# Patient Record
Sex: Female | Born: 1975 | Hispanic: No | Marital: Married
Health system: Southern US, Community
[De-identification: ages and names within clinical notes are randomized; demographics above are authoritative.]

---

## 2017-01-01 ENCOUNTER — Ambulatory Visit (HOSPITAL_COMMUNITY): Admission: EM | Admit: 2017-01-01 | Discharge: 2017-01-01 | Discharge status: Against Medical Advice | Payer: Self-pay

## 2018-07-04 ENCOUNTER — Emergency Department (HOSPITAL_COMMUNITY): Payer: BLUE CROSS/BLUE SHIELD

## 2018-07-04 ENCOUNTER — Emergency Department (HOSPITAL_COMMUNITY)
Admission: EM | Admit: 2018-07-04 | Discharge: 2018-07-04 | Disposition: A | Discharge status: Home / Self Care | Payer: BLUE CROSS/BLUE SHIELD | Attending: Emergency Medicine | Admitting: Emergency Medicine

## 2018-07-04 ENCOUNTER — Other Ambulatory Visit: Payer: Self-pay

## 2018-07-04 DIAGNOSIS — R0789 Other chest pain: Secondary | ICD-10-CM | POA: Diagnosis not present

## 2018-07-04 DIAGNOSIS — R079 Chest pain, unspecified: Secondary | ICD-10-CM | POA: Diagnosis present

## 2018-07-04 DIAGNOSIS — Z79899 Other long term (current) drug therapy: Secondary | ICD-10-CM | POA: Diagnosis not present

## 2018-07-04 LAB — BASIC METABOLIC PANEL
Anion gap: 11 (ref 5–15)
BUN: 8 mg/dL (ref 6–20)
CHLORIDE: 105 mmol/L (ref 98–111)
CO2: 21 mmol/L — ABNORMAL LOW (ref 22–32)
Calcium: 9.4 mg/dL (ref 8.9–10.3)
Creatinine, Ser: 0.8 mg/dL (ref 0.44–1.00)
GFR calc Af Amer: >60 mL/min (ref >60)
GFR calc non Af Amer: >60 mL/min (ref >60)
Glucose, Bld: 112 mg/dL — ABNORMAL HIGH (ref 70–99)
Potassium: 3.7 mmol/L (ref 3.5–5.1)
Sodium: 137 mmol/L (ref 135–145)

## 2018-07-04 LAB — CBC
HCT: 38.7 % (ref 36.0–46.0)
Hemoglobin: 11.9 g/dL — ABNORMAL LOW (ref 12.0–15.0)
MCH: 25.8 pg — ABNORMAL LOW (ref 26.0–34.0)
MCHC: 30.7 g/dL (ref 30.0–36.0)
MCV: 83.8 fL (ref 80.0–100.0)
Platelets: 447 10*3/uL — ABNORMAL HIGH (ref 150–400)
RBC: 4.62 MIL/uL (ref 3.87–5.11)
RDW: 14.1 % (ref 11.5–15.5)
WBC: 10.4 10*3/uL (ref 4.0–10.5)
nRBC: 0 % (ref 0.0–0.2)

## 2018-07-04 LAB — I-STAT BETA HCG BLOOD, ED (MC, WL, AP ONLY): I-stat hCG, quantitative: <5 m[IU]/mL (ref <5)

## 2018-07-04 LAB — I-STAT TROPONIN, ED: Troponin i, poc: 0 ng/mL (ref 0.00–0.08)

## 2018-07-04 LAB — TROPONIN I: Troponin I: <0.03 ng/mL (ref <0.03)

## 2018-07-04 NOTE — ED Notes (Signed)
Patient verbalizes understanding of discharge instructions. Opportunity for questioning and answers were provided. Armband removed by staff, pt discharged from ED.  

## 2018-07-04 NOTE — Discharge Instructions (Signed)
Your work-up today was overall reassuring.  Your chest x-ray showed no evidence of pneumonia or other lung abnormality.  Your troponin was negative twice and your heart score was low.  As we discussed, you have a less than 1% chance of major adverse cardiac event in the next 30 days.  Please follow-up with your primary doctor and stay hydrated.  He needs over-the-counter pain medication to help with the likely musculoskeletal chest wall pain.  If any symptoms change or worsen, return to nearest emergency department.

## 2018-07-04 NOTE — ED Notes (Signed)
Pt's son  [name] [phone].

## 2018-07-04 NOTE — ED Provider Notes (Signed)
[facility] EMERGENCY DEPARTMENT Provider Note   CSN: [id number] Arrival date & time: 07/04/18  1918    History   Chief Complaint Chief Complaint  Patient presents with  . Hypertension  . Chest Pain    HPI Renee Owens is a 43 y.o. female.     The history is provided by the patient and medical records. No language interpreter was used.  Chest Pain  Pain location:  Substernal area Pain quality: aching   Pain radiates to:  Does not radiate Pain severity:  Moderate Onset quality:  Gradual Duration:  1 day Timing:  Intermittent Progression:  Improving Chronicity:  Recurrent Context: at rest   Context: not breathing, not movement, not stress and not trauma   Relieved by:  Nothing Worsened by:  Nothing Ineffective treatments:  None tried Associated symptoms: cough   Associated symptoms: no anxiety, no back pain, no diaphoresis, no dizziness, no dysphagia, no fatigue, no fever, no headache, no heartburn, no lower extremity edema, no nausea, no near-syncope, no palpitations, no shortness of breath, no syncope, no vomiting and no weakness   Risk factors: hypertension   Risk factors: no coronary artery disease, no diabetes mellitus, no immobilization, not female, not obese, not pregnant, no prior DVT/PE, no smoking and no surgery     No past medical history on file.  There are no active problems to display for this patient.    OB History   No obstetric history on file.      Home Medications    Prior to Admission medications   Medication Sig Start Date End Date Taking? Authorizing Provider  telmisartan-hydrochlorothiazide (MICARDIS HCT) 40-12.5 MG tablet  06/27/18   [provider]  TRI-SPRINTEC 0.18/0.215/0.25 MG-35 MCG tablet Take 1 tablet by mouth daily. 04/11/18   [provider]    Family History No family history on file.  Social History Social History   Tobacco Use  . Smoking status: Not on file  Substance Use Topics   . Alcohol use: Not on file  . Drug use: Not on file     Allergies   Patient has no known allergies.   Review of Systems Review of Systems  Constitutional: Negative for appetite change, diaphoresis, fatigue and fever.  HENT: Negative for congestion, rhinorrhea and trouble swallowing.   Eyes: Negative for photophobia and visual disturbance.  Respiratory: Positive for cough. Negative for chest tightness, shortness of breath, wheezing and stridor.   Cardiovascular: Positive for chest pain. Negative for palpitations, leg swelling, syncope and near-syncope.  Gastrointestinal: Negative for constipation, diarrhea, heartburn, nausea and vomiting.  Genitourinary: Negative for dysuria and flank pain.  Musculoskeletal: Negative for back pain, neck pain and neck stiffness.  Skin: Negative for rash and wound.  Neurological: Negative for dizziness, weakness, light-headedness and headaches.  Psychiatric/Behavioral: Negative for agitation.  All other systems reviewed and are negative.    Physical Exam Updated Vital Signs BP (!) 118/95   Pulse 77   Temp 98.2 F (36.8 C) (Oral)   Resp 18   Ht  (1.651 m)   Wt 72 kg   LMP 06/20/2018   SpO2 100%   BMI 26.41 kg/m   Physical Exam Vitals signs and nursing note reviewed.  Constitutional:      General: She is not in acute distress.    Appearance: She is well-developed.  HENT:     Head: Normocephalic and atraumatic.  Eyes:     Extraocular Movements: Extraocular movements intact.  Conjunctiva/sclera: Conjunctivae normal.     Pupils: Pupils are equal, round, and reactive to light.  Neck:     Musculoskeletal: Neck supple.  Cardiovascular:     Rate and Rhythm: Normal rate and regular rhythm.     Heart sounds: No murmur.  Pulmonary:     Effort: Pulmonary effort is normal. No tachypnea or respiratory distress.     Breath sounds: Normal breath sounds. No decreased breath sounds, wheezing, rhonchi or rales.  Abdominal:      Palpations: Abdomen is soft.     Tenderness: There is no abdominal tenderness.  Musculoskeletal:     Right lower leg: She exhibits no tenderness. No edema.     Left lower leg: She exhibits no tenderness. No edema.  Skin:    General: Skin is warm and dry.     Findings: No ecchymosis.  Neurological:     General: No focal deficit present.     Mental Status: She is alert.  Psychiatric:        Mood and Affect: Mood normal.      ED Treatments / Results  Labs (all labs ordered are listed, but only abnormal results are displayed) Labs Reviewed  BASIC METABOLIC PANEL - Abnormal; Notable for the following components:      Result Value   CO2 21 (*)    Glucose, Bld 112 (*)    All other components within normal limits  CBC - Abnormal; Notable for the following components:   Hemoglobin 11.9 (*)    MCH 25.8 (*)    Platelets 447 (*)    All other components within normal limits  TROPONIN I  I-STAT BETA HCG BLOOD, ED ([facility], [facility], [facility] ONLY)  I-STAT TROPONIN, ED    EKG EKG Interpretation  Date/Time:  Monday July 04 2018 19:52:55 EDT Ventricular Rate:  88 PR Interval:  150 QRS Duration: 70 QT Interval:  328 QTC Calculation: 396 R Axis:   30 Text Interpretation:  Normal sinus rhythm with sinus arrhythmia Cannot rule out Anterior infarct , age undetermined Abnormal ECG No prior ECG for comparison.  No STEMI Confirmed by Theda Belfast ([id number]) on 07/04/2018 9:04:18 PM   Radiology Dg Chest 2 View  Result Date: 07/04/2018 CLINICAL DATA:  Central chest pain for 2 days. Nonsmoker. EXAM: CHEST - 2 VIEW COMPARISON:  None. FINDINGS: Midline trachea.  Normal heart size and mediastinal contours. Sharp costophrenic angles.  No pneumothorax.  Clear lungs. IMPRESSION: Normal chest. Electronically Signed   By: Jeronimo Greaves M.D.   On: 07/04/2018 20:07    Procedures Procedures (including critical care time)  Medications Ordered in ED Medications - No data to display   Initial Impression /  Assessment and Plan / ED Course  I have reviewed the triage vital signs and the nursing notes.  Pertinent labs & imaging results that were available during my care of the patient were reviewed by me and considered in my medical decision making (see chart for details).        Renee Owens is a 43 y.o. female with a past medical history significant for hypertension who presents with chest discomfort.  She reports that this morning she started having gradual onset of chest discomfort in her central chest.  It does not radiate and is not related to exertion or deep breathing.  No history of DVT or PE and has had no leg pain or leg swelling.  No recent travel reported.  He denies significant fevers or chills or congestion but she  does report a mild dry cough.  She denies any sick contacts.  She reports this pain feels similar to when she is had musculoskeletal pain in the past.  She reports that her lesser list of blood pressure has been slightly higher and reports he is taking more of her blood pressure medication.  She says that she has had no lightheadedness, palpitations, syncope, nausea, vomiting, or other symptoms.  She reports her pain at its worst was a 5 or 6 out of 10 and is now a 2 out of 10.  She thinks it is the muscles in her chest but wanted to be sure.  On exam, lungs are clear and chest is nontender.  Abdomen is nontender.  No focal neurologic deficits.  Patient resting comfortably on room air with reassuring vital signs.  Blood pressure was not severely elevated on my initial evaluation.  EKG shows no STEMI.  Heart score calculated as a 1.  Based on patient's improvement in symptoms and lack of any pleuritic pain, shortness of breath, or sharp chest pain, have very low suspicion for pulmonary ballismus cause of symptoms.  Discussed with patient and she agrees to hold on further thromboembolic etiology at this time.  Patient agreed to a delta troponin which was checked and was negative.   Chest x-ray reassuring other labs reassuring.  Given improvement in symptoms, patient would like to go home.  Patient will follow-up with a PCP and understood return precautions for new or worsened symptoms.  She had no other questions or concerns and was discharged in good condition with improved symptoms.  Blood pressure was normotensive on reassessment and do not feel blood pressure medication needs to be altered at this time.  She will follow-up with PCP for this.   Final Clinical Impressions(s) / ED Diagnoses   Final diagnoses:  Atypical chest pain    ED Discharge Orders    None     Clinical Impression: 1. Atypical chest pain     Disposition: Discharge  Condition: Good  I have discussed the results, Dx and Tx plan with the pt(& family if present). He/she/they expressed understanding and agree(s) with the plan. Discharge instructions discussed at great length. Strict return precautions discussed and pt &/or family have verbalized understanding of the instructions. No further questions at time of discharge.    Discharge Medication List as of 07/04/2018 10:50 PM      Follow Up: [facility] [address] [place] [address] [phone] Schedule an appointment as soon as possible for a visit    [facility] EMERGENCY DEPARTMENT [address] [id number] [address] [address] [phone]       Janis Cuffe, Canary Brim, MD 07/04/18 [id number]

## 2018-07-04 NOTE — ED Triage Notes (Signed)
Patient c/o high blood pressure (took extra doses of BP medication) that has evolved into CP and SOB.  Denies N/V.

## 2019-12-27 IMAGING — CR CHEST - 2 VIEW
2 series · 2 of 2 positions shown · non-contrast
Comparison: None.

CLINICAL DATA: Central chest pain for 2 days. Nonsmoker.

EXAM:
CHEST - 2 VIEW

[chest lat]
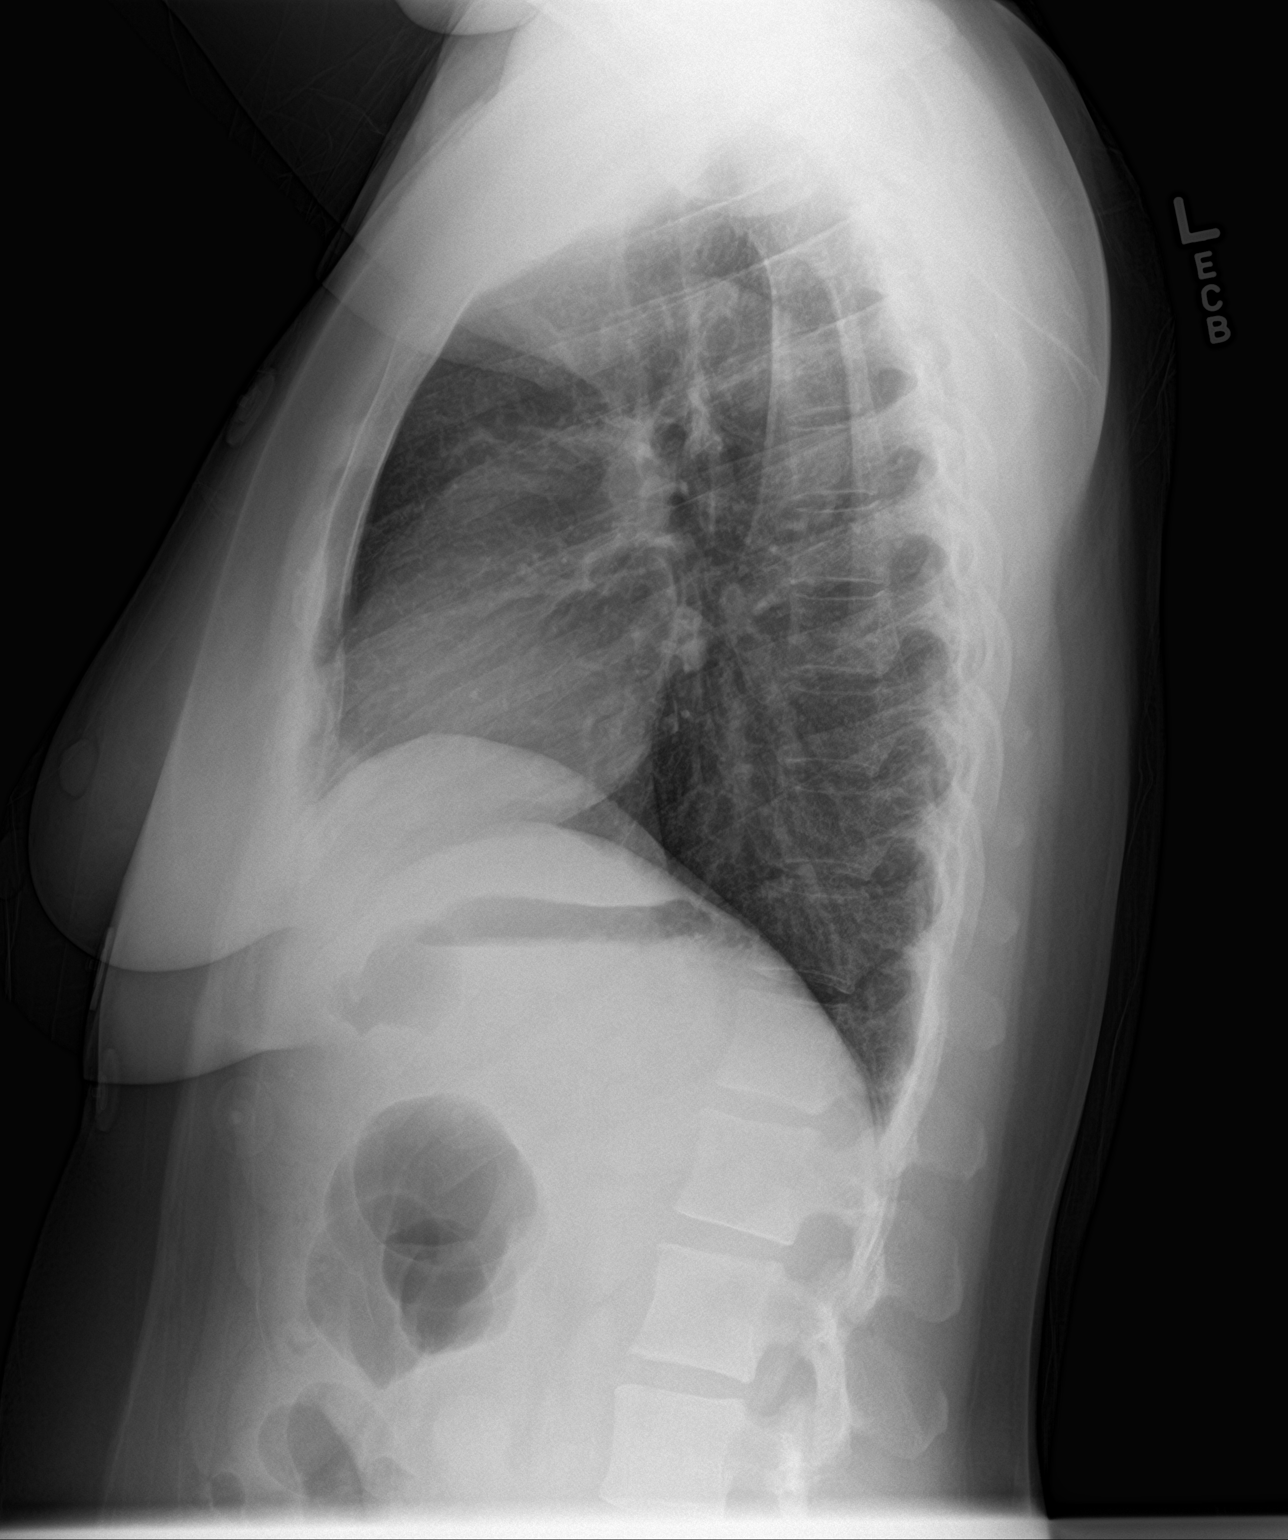

[chest pa]
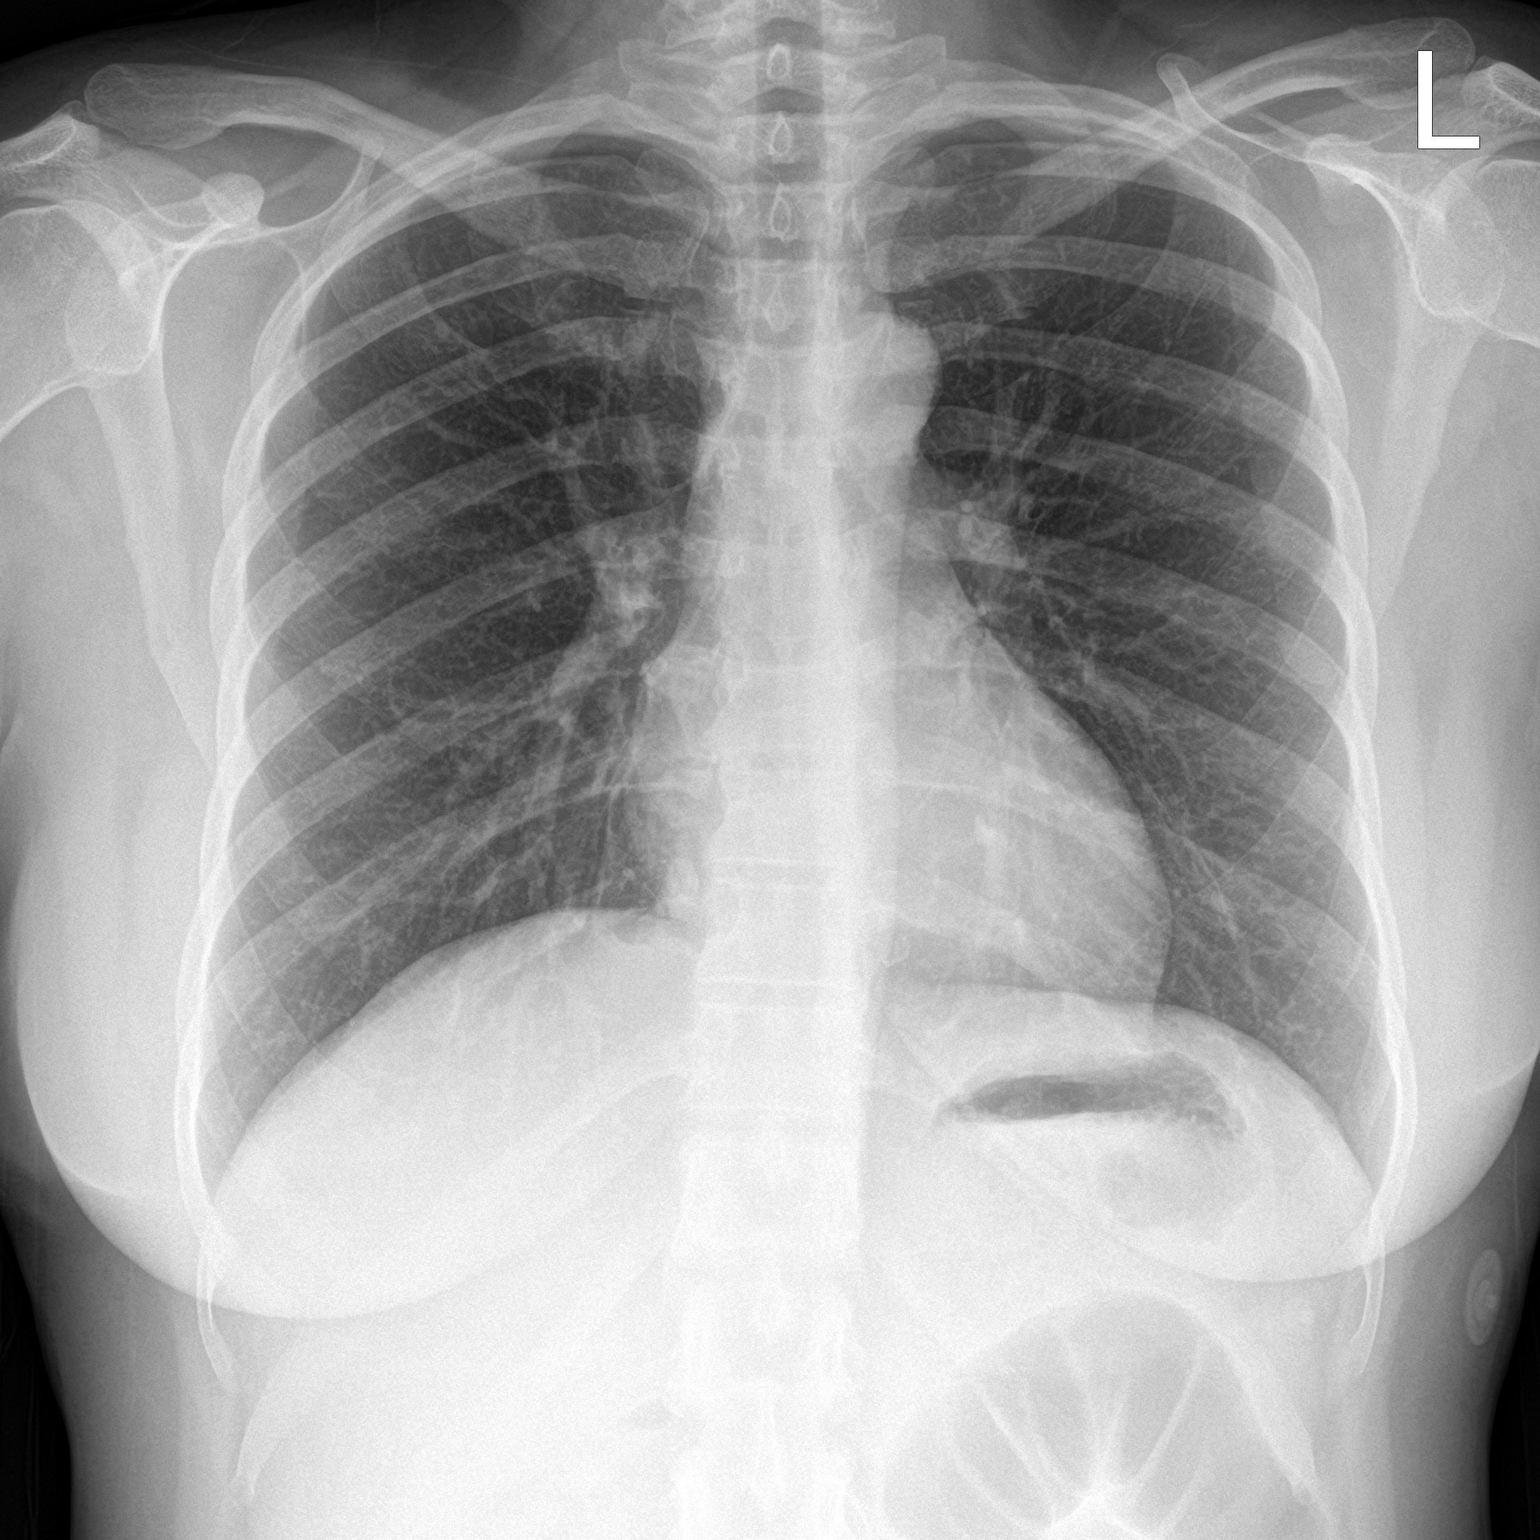

[2 of 2 positions shown; findings below may reference images not displayed]

FINDINGS: Midline trachea.  Normal heart size and mediastinal contours.

Sharp costophrenic angles.  No pneumothorax.  Clear lungs.
IMPRESSION: Normal chest.

## 2022-04-16 ENCOUNTER — Ambulatory Visit: Payer: BLUE CROSS/BLUE SHIELD | Admitting: Internal Medicine

## 2022-09-04 ENCOUNTER — Ambulatory Visit: Payer: BLUE CROSS/BLUE SHIELD | Admitting: Internal Medicine
